# Patient Record
Sex: Male | Born: 2005 | Race: White | Hispanic: No | Marital: Single | Smoking: Never smoker
Health system: Southern US, Community
[De-identification: ages and names within clinical notes are randomized; demographics above are authoritative.]

## PROBLEM LIST (undated history)

## (undated) DIAGNOSIS — T7840XA Allergy, unspecified, initial encounter: Secondary | ICD-10-CM

## (undated) DIAGNOSIS — L309 Dermatitis, unspecified: Secondary | ICD-10-CM

## (undated) HISTORY — DX: Dermatitis, unspecified: L30.9

## (undated) HISTORY — DX: Allergy, unspecified, initial encounter: T78.40XA

---
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
## 2024-01-30 DIAGNOSIS — Z419 Encounter for procedure for purposes other than remedying health state, unspecified: Secondary | ICD-10-CM | POA: Diagnosis not present

## 2024-04-30 DIAGNOSIS — Z419 Encounter for procedure for purposes other than remedying health state, unspecified: Secondary | ICD-10-CM | POA: Diagnosis not present
